# Patient Record
Sex: Female | Born: 1955 | Race: White | Hispanic: No | Smoking: Current some day smoker
Health system: Southern US, Community
[De-identification: ages and names within clinical notes are randomized; demographics above are authoritative.]

## PROBLEM LIST (undated history)

## (undated) HISTORY — PX: OTHER SURGICAL HISTORY: SHX169

---

## 1999-11-21 ENCOUNTER — Other Ambulatory Visit (HOSPITAL_COMMUNITY): Admission: RE | Admit: 1999-11-21 | Discharge: 1999-12-06 | Payer: Self-pay | Admitting: Psychiatry

## 2002-10-14 ENCOUNTER — Encounter: Admission: RE | Admit: 2002-10-14 | Discharge: 2002-10-14 | Payer: Self-pay | Admitting: Family Medicine

## 2002-10-14 ENCOUNTER — Encounter: Payer: Self-pay | Admitting: Family Medicine

## 2002-11-11 ENCOUNTER — Encounter: Admission: RE | Admit: 2002-11-11 | Discharge: 2002-11-11 | Payer: Self-pay | Admitting: Family Medicine

## 2003-08-04 ENCOUNTER — Encounter: Admission: RE | Admit: 2003-08-04 | Discharge: 2003-08-04 | Payer: Self-pay | Admitting: Family Medicine

## 2003-08-04 ENCOUNTER — Encounter: Payer: Self-pay | Admitting: Family Medicine

## 2004-01-01 ENCOUNTER — Encounter: Admission: RE | Admit: 2004-01-01 | Discharge: 2004-01-01 | Payer: Self-pay | Admitting: Family Medicine

## 2004-05-12 ENCOUNTER — Encounter: Admission: RE | Admit: 2004-05-12 | Discharge: 2004-05-12 | Payer: Self-pay | Admitting: Sports Medicine

## 2004-05-12 ENCOUNTER — Ambulatory Visit (HOSPITAL_COMMUNITY): Admission: RE | Admit: 2004-05-12 | Discharge: 2004-05-12 | Payer: Self-pay | Admitting: Family Medicine

## 2004-06-27 ENCOUNTER — Emergency Department (HOSPITAL_COMMUNITY): Admission: EM | Admit: 2004-06-27 | Discharge: 2004-06-27 | Payer: Self-pay | Admitting: Emergency Medicine

## 2004-10-18 ENCOUNTER — Ambulatory Visit: Payer: Self-pay | Admitting: Family Medicine

## 2005-02-23 ENCOUNTER — Encounter: Admission: RE | Admit: 2005-02-23 | Discharge: 2005-02-23 | Payer: Self-pay | Admitting: Sports Medicine

## 2005-08-10 ENCOUNTER — Ambulatory Visit: Payer: Self-pay | Admitting: Family Medicine

## 2006-03-31 ENCOUNTER — Encounter (INDEPENDENT_AMBULATORY_CARE_PROVIDER_SITE_OTHER): Payer: Self-pay | Admitting: *Deleted

## 2006-04-05 ENCOUNTER — Encounter: Payer: Self-pay | Admitting: Family Medicine

## 2006-04-05 ENCOUNTER — Ambulatory Visit: Payer: Self-pay | Admitting: Family Medicine

## 2006-04-16 ENCOUNTER — Ambulatory Visit: Payer: Self-pay | Admitting: Family Medicine

## 2006-12-12 ENCOUNTER — Encounter: Admission: RE | Admit: 2006-12-12 | Discharge: 2006-12-12 | Payer: Self-pay | Admitting: Family Medicine

## 2007-02-21 DIAGNOSIS — E039 Hypothyroidism, unspecified: Secondary | ICD-10-CM | POA: Insufficient documentation

## 2007-02-21 DIAGNOSIS — F17200 Nicotine dependence, unspecified, uncomplicated: Secondary | ICD-10-CM | POA: Insufficient documentation

## 2007-02-21 DIAGNOSIS — F339 Major depressive disorder, recurrent, unspecified: Secondary | ICD-10-CM | POA: Insufficient documentation

## 2007-02-22 ENCOUNTER — Encounter (INDEPENDENT_AMBULATORY_CARE_PROVIDER_SITE_OTHER): Payer: Self-pay | Admitting: *Deleted

## 2007-06-18 ENCOUNTER — Telehealth: Payer: Self-pay | Admitting: *Deleted

## 2007-06-27 ENCOUNTER — Encounter: Payer: Self-pay | Admitting: Family Medicine

## 2007-06-27 ENCOUNTER — Ambulatory Visit: Payer: Self-pay | Admitting: Family Medicine

## 2007-07-15 ENCOUNTER — Encounter: Payer: Self-pay | Admitting: Family Medicine

## 2007-10-16 ENCOUNTER — Encounter: Payer: Self-pay | Admitting: Family Medicine

## 2007-10-16 ENCOUNTER — Ambulatory Visit: Payer: Self-pay | Admitting: Family Medicine

## 2007-10-16 LAB — CONVERTED CEMR LAB
Cholesterol: 237 mg/dL — ABNORMAL HIGH (ref 0–200)
HDL: 61 mg/dL (ref >39)
LDL Cholesterol: 158 mg/dL — ABNORMAL HIGH (ref 0–99)
Total CHOL/HDL Ratio: 3.9
Triglycerides: 92 mg/dL (ref <150)
VLDL: 18 mg/dL (ref 0–40)

## 2007-10-18 ENCOUNTER — Encounter: Payer: Self-pay | Admitting: Family Medicine

## 2007-12-13 ENCOUNTER — Telehealth: Payer: Self-pay | Admitting: *Deleted

## 2007-12-17 ENCOUNTER — Encounter: Admission: RE | Admit: 2007-12-17 | Discharge: 2007-12-17 | Payer: Self-pay | Admitting: Family Medicine

## 2007-12-30 ENCOUNTER — Ambulatory Visit: Payer: Self-pay | Admitting: Family Medicine

## 2008-03-11 ENCOUNTER — Telehealth: Payer: Self-pay | Admitting: Family Medicine

## 2008-03-11 ENCOUNTER — Encounter: Payer: Self-pay | Admitting: Family Medicine

## 2008-12-03 ENCOUNTER — Ambulatory Visit: Payer: Self-pay | Admitting: Family Medicine

## 2008-12-03 ENCOUNTER — Encounter: Payer: Self-pay | Admitting: Family Medicine

## 2008-12-04 ENCOUNTER — Encounter: Payer: Self-pay | Admitting: Family Medicine

## 2008-12-23 ENCOUNTER — Encounter: Admission: RE | Admit: 2008-12-23 | Discharge: 2008-12-23 | Payer: Self-pay | Admitting: Family Medicine

## 2010-01-14 ENCOUNTER — Ambulatory Visit: Payer: Self-pay | Admitting: Family Medicine

## 2010-01-14 DIAGNOSIS — J209 Acute bronchitis, unspecified: Secondary | ICD-10-CM | POA: Insufficient documentation

## 2010-02-23 ENCOUNTER — Encounter: Admission: RE | Admit: 2010-02-23 | Discharge: 2010-02-23 | Payer: Self-pay | Admitting: Family Medicine

## 2010-03-17 ENCOUNTER — Ambulatory Visit: Payer: Self-pay | Admitting: Family Medicine

## 2010-03-17 LAB — CONVERTED CEMR LAB
LDL Cholesterol: 152 mg/dL — ABNORMAL HIGH (ref 0–99)
Pap Smear: NEGATIVE
TSH: 3.156 microintl units/mL (ref 0.350–4.500)
Total CHOL/HDL Ratio: 3.8
VLDL: 27 mg/dL (ref 0–40)

## 2010-03-18 ENCOUNTER — Encounter: Payer: Self-pay | Admitting: Family Medicine

## 2010-04-07 ENCOUNTER — Ambulatory Visit: Payer: Self-pay | Admitting: Family Medicine

## 2010-04-07 DIAGNOSIS — H9209 Otalgia, unspecified ear: Secondary | ICD-10-CM | POA: Insufficient documentation

## 2010-04-07 DIAGNOSIS — L821 Other seborrheic keratosis: Secondary | ICD-10-CM | POA: Insufficient documentation

## 2010-05-12 ENCOUNTER — Encounter: Payer: Self-pay | Admitting: Family Medicine

## 2010-05-12 ENCOUNTER — Ambulatory Visit: Payer: Self-pay | Admitting: Family Medicine

## 2010-05-12 ENCOUNTER — Telehealth: Payer: Self-pay | Admitting: Family Medicine

## 2010-05-17 ENCOUNTER — Telehealth: Payer: Self-pay | Admitting: Family Medicine

## 2010-10-07 ENCOUNTER — Ambulatory Visit: Payer: Self-pay | Admitting: Family Medicine

## 2010-10-07 DIAGNOSIS — L989 Disorder of the skin and subcutaneous tissue, unspecified: Secondary | ICD-10-CM | POA: Insufficient documentation

## 2010-11-30 ENCOUNTER — Ambulatory Visit: Payer: Self-pay | Admitting: Family Medicine

## 2010-11-30 DIAGNOSIS — R059 Cough, unspecified: Secondary | ICD-10-CM | POA: Insufficient documentation

## 2010-11-30 DIAGNOSIS — R05 Cough: Secondary | ICD-10-CM

## 2010-12-01 ENCOUNTER — Telehealth (INDEPENDENT_AMBULATORY_CARE_PROVIDER_SITE_OTHER): Payer: Self-pay | Admitting: *Deleted

## 2011-01-26 ENCOUNTER — Encounter: Payer: Self-pay | Admitting: *Deleted

## 2011-01-26 NOTE — Progress Notes (Signed)
Summary: phn msg  Phone Note Call from Patient Call back at Home Phone [phone]   Caller: Patient Summary of Call: pt is concerned about yellowish mucus she is coughing up Initial call taken by: De Nurse,  December 01, 2010 9:34 AM  Follow-up for Phone Call        spoke with patint and she was in to see Dr. Wallene Huh yesterday .  she needed reassurance about what MD told her yesterday. again advised patient if she has fever , symptoms continuing for another week to call us back. went over all th symptomatic treatmnt MD suggested.  Follow-up by: Theresia Lo RN,  December 01, 2010 10:49 AM

## 2011-01-26 NOTE — Progress Notes (Signed)
Summary: triage  Phone Note Call from Patient Call back at Work Phone [phone]   Caller: Patient Summary of Call: pt states that prednisone has helped and needs a little more to take care of the rest of the poison ivy [facility] [address] Initial call taken by: De Nurse,  May 17, 2010 9:29 AM  Follow-up for Phone Call        [facility] [address]. states she was back in yard & has new spots on her face. requesting a few more days of steroids. was just here & does not want to come back in. to pcp Follow-up by: Golden Circle RN,  May 17, 2010 9:31 AM  Additional Follow-up for Phone Call Additional follow up Details #1::        Let her know we sent it in and to take the tapering doses as prescribed thanks Additional Follow-up by: Pearlean Brownie MD,  May 17, 2010 10:19 AM    Additional Follow-up for Phone Call Additional follow up Details #2::    pt informed Follow-up by: Golden Circle RN,  May 17, 2010 11:10 AM  New/Updated Medications: PREDNISONE 1 MG TABS (PREDNISONE) 2 by mouth two times a day for 2 days then 3 per day for 2 days then 2 per day for 2 days the 1 a day for 2 days Prescriptions: PREDNISONE 1 MG TABS (PREDNISONE) 2 by mouth two times a day for 2 days then 3 per day for 2 days then 2 per day for 2 days the 1 a day for 2 days  #30 x 0   Entered and Authorized by:   Pearlean Brownie MD   Signed by:   Pearlean Brownie MD on 05/17/2010   Method used:   Electronically to        [facility]  [phone]* (retail)       [address] [place], [place]  [address]       Ph: [phone] or [phone]       Fax: [phone]   RxID:   [id number]

## 2011-01-26 NOTE — Miscellaneous (Signed)
Summary: Procedures Consent  Procedures Consent   Imported By: De Nurse 05/13/2010 16:07:11  _____________________________________________________________________  External Attachment:    Type:   Image     Comment:   External Document

## 2011-01-26 NOTE — Assessment & Plan Note (Signed)
Summary: cough,df   Vital Signs:  Patient profile:   55 year old female Height:      67.75 inches Weight:      208 pounds BMI:     31.98 Temp:     98.0 degrees F oral BP sitting:   112 / 78  (left arm)  Vitals Entered By: Tessie Fass, CMA CC: cough and congestion Is Patient Diabetic? No Pain Assessment Patient in pain? no        CC:  cough and congestion.  History of Present Illness: c/o cough for one week associated with running nose, weakness, bilateral ear pain and generalized malaise.  Cough is persistant, coughing up small amounts of mucus, some instances of yellowish sputum. States symptoms are somewhat improved with Theraflu and no aggravating factors.  Denies dizziness, facial pain, sorethroat, sob or chest pain.    Habits & Providers  Alcohol-Tobacco-Diet     Tobacco Status: current     Cigarette Packs/Day: <0.25  Current Medications (verified): 1)  Synthroid 150 Mcg Tabs (Levothyroxine Sodium) .... Take 1 Tablet By Mouth Once A Day 2)  Celexa 20 Mg Tabs (Citalopram Hydrobromide) .Marland Kitchen.. 1 Once Daily 3)  Hydromet 5-1.5 Mg/48ml Syrp (Hydrocodone-Homatropine) .... One Teaspoonful Three Times A Day As Needed For Cough, 120 Cc 4)  Ventolin Hfa 108 (90 Base) Mcg/act Aers (Albuterol Sulfate) .... 2 Puffs Q 4-6 As Needed Cough and Wheeze 5)  Prednisone 20 Mg Tabs (Prednisone) .... 2 Tabs Daily For 5 Days  Allergies (verified): No Known Drug Allergies  Social History: Packs/Day:  <0.25  Review of Systems General:  Complains of chills and malaise; denies fever, loss of appetite, and sweats. Eyes:  Denies discharge and eye irritation. ENT:  Complains of earache and nasal congestion; denies decreased hearing, difficulty swallowing, ear discharge, hoarseness, ringing in ears, sinus pressure, and sore throat. CV:  Denies chest pain or discomfort, difficulty breathing at night, difficulty breathing while lying down, and lightheadness. Resp:  Complains of cough and  wheezing; denies chest discomfort and shortness of breath.  Physical Exam  General:  Well-developed,well-nourished,in no acute distress; alert,appropriate and cooperative throughout examination Head:  Normocephalic and atraumatic without obvious abnormalities. No apparent alopecia or balding. Eyes:  No corneal or conjunctival inflammation noted. EOMI. Perrla. Vision grossly intact. Ears:  External ear exam shows no significant lesions or deformities.  Otoscopic examination reveals clear canals, tympanic membranes are intact bilaterally without bulging, retraction, inflammation or discharge. Hearing is grossly normal bilaterally. Nose:  no external deformity, no external erythema, no nasal discharge, no mucosal pallor, no mucosal edema, and no airflow obstruction.   Mouth:  Oral mucosa and oropharynx without lesions or exudates.  pharynx pink and moist and no erythema.   Neck:  No deformities, masses, or tenderness noted. Chest Wall:  No deformities, masses, or tenderness noted. Lungs:  normal respiratory effort, no intercostal retractions, no accessory muscle use, bilateral expiratory and inspiratory wheezing.   Heart:  Normal rate and regular rhythm. S1 and S2 normal without gallop, murmur, click, rub or other extra sounds. Skin:  Intact without suspicious lesions or rashes    Impression & Recommendations:  Problem # 1:  GYNECOLOGICAL EXAMINATIONOUTINE (ICD-V72.31) Persistent cough with bilateral wheezing on auscultation, but absent infectious process as evidenced by no fever, weakness, loss of appetitie or mucopurulent phlegm.  Will treat patient for bronchitis with Prednisone, Ventolin and Hydromet.  Discussed tobacco cessation.  Patient instructed in correct use of inhaler and principles of viral vs bacterial infection.  Instructed to return to office if symptoms worsen or if develops fever, productive cough, sob or chest pain.  Problem # 2:  TOBACCO DEPENDENCE (ICD-305.1) Counseled to  quit Orders: [facility]- Est Level  3 ([address])  Problem # 3:  HYPOTHYROIDISM, UNSPECIFIED (ICD-244.9) TSH due Her updated medication list for this problem includes:    Synthroid 150 Mcg Tabs (Levothyroxine sodium) .Marland Kitchen... Take 1 tablet by mouth once a day  Orders: TSH-[facility] ([address]11) [facility]- Est Level  3 ([address])  Complete Medication List: 1)  Synthroid 150 Mcg Tabs (Levothyroxine sodium) .... Take 1 tablet by mouth once a day 2)  Celexa 20 Mg Tabs (Citalopram hydrobromide) .Marland Kitchen.. 1 once daily 3)  Hydromet 5-1.5 Mg/29ml Syrp (Hydrocodone-homatropine) .... One teaspoonful three times a day as needed for cough, 120 cc 4)  Ventolin Hfa 108 (90 Base) Mcg/act Aers (Albuterol sulfate) .... 2 puffs q 4-6 as needed cough and wheeze 5)  Prednisone 20 Mg Tabs (Prednisone) .... 2 tabs daily for 5 days  Patient Instructions: 1)  Take prednisone as prescribed 2)  Use Ventolin as instructed for cough throughout day. 3)  Hydromet is also for cough but use when not driving or alertness is not required.  4)  Stop smoking. Drink plenty of fluids.  Return if symptoms get worse or if develop fever with productive cough, sob or chest pain. Prescriptions: PREDNISONE 20 MG TABS (PREDNISONE) 2 tabs daily for 5 days Brand medically necessary #1- x 0   Entered and Authorized by:   Luretha Murphy NP   Signed by:   Luretha Murphy NP on 01/14/2010   Method used:   Print then Give to Patient   RxID:   [id number] VENTOLIN HFA 108 (90 BASE) MCG/ACT AERS (ALBUTEROL SULFATE) 2 puffs q 4-6 as needed cough and wheeze Brand medically necessary #1 x 1   Entered and Authorized by:   Luretha Murphy NP   Signed by:   Luretha Murphy NP on 01/14/2010   Method used:   Print then Give to Patient   RxID:   [id number] HYDROMET 5-1.5 MG/5ML SYRP (HYDROCODONE-HOMATROPINE) one teaspoonful three times a day as needed for cough, 120 cc Brand medically necessary #1 x 0   Entered and Authorized by:   Luretha Murphy NP   Signed by:   Luretha Murphy NP  on 01/14/2010   Method used:   Print then Give to Patient   RxID:   [id number]   Appended Document: cough,df Incorrect diagnosis entered into [facility] problem #1, should read Acute Broncitis.

## 2011-01-26 NOTE — Assessment & Plan Note (Signed)
Summary: ? broken blood vessel to hand/chambliss/bmc   Vital Signs:  Patient profile:   55 year old female Height:      67.75 inches Weight:      210.25 pounds BMI:     32.32 BSA:     2.08 Pulse rate:   84 / minute BP sitting:   122 / 82  Vitals Entered By: Jone Baseman CMA (October 07, 2010 10:10 AM) CC: ?broken blood vessel in left hand x 1 week Is Patient Diabetic? No Pain Assessment Patient in pain? yes     Location: left hand Type: tender to touch   CC:  ?broken blood vessel in left hand x 1 week.  History of Present Illness: 55 yo F:  1. 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  Habits & Providers  Alcohol-Tobacco-Diet     Tobacco Status: current     Tobacco Counseling: to remain off tobacco products     Cigarette Packs/Day: <0.25  Current Medications (verified): 1)  Synthroid 150 Mcg Tabs (Levothyroxine Sodium) .... Take 1 Tablet By Mouth Once A Day  Allergies (verified): No Known Drug Allergies PMH-FH-SH reviewed for relevance  Review of Systems      See HPI  Physical Exam  General:  Well-developed, well-nourished, in no acute distress; alert, appropriate and cooperative throughout examination. Vitals reviewed. Skin:  Left Hand: Proximal palm with 1 cm diameter, nodule with blue hue, mildly TTP, no other erythema, warm, or skin changes, no open lesion, does not get smaller/flatter with elevation of hand, normal strength and reflexes UE. Right hand WNL, prominent blue vein in same area of palm.   Impression & Recommendations:  Problem # 1:  SKIN LESION (ICD-709.9) Assessment New  Unknown etiology at this time. No red flags. Precepted with Dr. McDiarmid. DDx: fibroma, vascular, traumatic, (very unlikely infectious). Patient to call in one week. If not improved, will send to Dermatology  for possible Bx.  Orders: [facility]- Est Level  3 ([address])  Complete Medication List: 1)  Synthroid 150 Mcg Tabs (Levothyroxine sodium) .... Take 1 tablet by mouth once a day  Patient Instructions: 1)  It was nice to meet you today! 2)  We are not sure what is causing the nodule on your palm. 3)  If it does not go away in 1 week, we are referring you to a dermatologist.

## 2011-01-26 NOTE — Assessment & Plan Note (Signed)
Summary: posion ivy per pt/[place]te/cham,bliss   Vital Signs:  Patient profile:   55 year old female Weight:      210.8 pounds Temp:     98 degrees F oral Pulse rate:   77 / minute Pulse rhythm:   regular BP sitting:   106 / 73  (left arm) Cuff size:   regular  Vitals Entered By: Loralee Pacas CMA (May 12, 2010 10:01 AM) CC: posion ivy x4 days, skin lesion Comments pt would also like for you to look at a "growth" that is on her right temple at the hairline that was previously sprayed with cold spray per patient   CC:  posion ivy x4 days and skin lesion.  History of Present Illness: 1.  poison ivy--thinks dog brought it in.  past 4 days.  red rash initially on left hand, but then rash erupted on face and right leg as well.  has had poison ivy before and seems to be very sensitive to it. has tried to wash everything, sheet, etc.  2.  lesion on forehead--thought to be a seb K.  had cryotherapy just over a month ago, but it never really went away.    Habits & Providers  Alcohol-Tobacco-Diet     Tobacco Status: current     Tobacco Counseling: to remain off tobacco products     Cigarette Packs/Day: <0.25  Current Medications (verified): 1)  Synthroid 150 Mcg Tabs (Levothyroxine Sodium) .... Take 1 Tablet By Mouth Once A Day  Allergies: No Known Drug Allergies  Social History: Smoking Status:  current  Review of Systems  The patient denies fever and weight loss.   General:  Denies fever. Derm:  Denies changes in nail beds, dryness, and poor wound healing.  Physical Exam  General:  Well-developed,well-nourished,in no acute distress; alert,appropriate and cooperative throughout examination Skin:  erythematous rash on left hand, left cheek and right of nasal bridge consistent with contact dermatitis  flesh colored rough 7 mm lesion on R forehead consistent with a seb k.  Freeze - thaw - freeze with liquid nitrogen performed tolerated well  Additional Exam:  vital signs reviewed      Impression & Recommendations:  Problem # 1:  POISON IVY DERMATITIS (ICD-692.6) Assessment New  given the rash on the face, will treat with prednisone.  can also use antihistamines for itching.   Her updated medication list for this problem includes:    Prednisone 20 Mg Tabs (Prednisone) .Marland Kitchen... 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  However, if it returns, likely needs biopsy to rule out basal cell.  advised pt to come back if lesion returns.  Orders: Cryo (1st lesion) benign - [facility] (17000) [facility]- Est Level  3 ([address])  Complete Medication List: 1)  Synthroid 150 Mcg Tabs (Levothyroxine sodium) .... Take 1 tablet by mouth once a day 2)  Prednisone 20 Mg Tabs (Prednisone) .... 2 tabs by mouth daily for 5 days for poison ivy  Patient Instructions: 1)  It was nice to see you today. 2)  For your poison ivy, take the prednisone I prescribed.   3)  You can also take benedryl or zyrtec for itching. 4)  You can use benedryl (diphenhydramine) cream to help with itching as well. 5)  If the spot on your forehead comes back, make an appointment to have it biopsied (  cut off). 6)  Call us if the rash gets any closer to your eye. Prescriptions: PREDNISONE 20 MG TABS (PREDNISONE) 2 tabs by mouth daily for 5 days for poison ivy  #10 x 0   Entered and Authorized by:   Asher Muir MD   Signed by:   Asher Muir MD on 05/12/2010   Method used:   [facility] to        [facility]  [phone]* (retail)       [address] [place], [place]  [address]       Ph: [phone] or [phone]       Fax: [phone]   RxID:   [id number] PREDNISONE 20 MG TABS (PREDNISONE) 2 tabs by mouth daily for 5 days for poison ivy  #10 x 0   Entered and Authorized by:   Asher Muir MD   Signed by:   Asher Muir MD on 05/12/2010    Method used:   Electronically to        [facility] # [phone]* (retail)       [address]       [place], [place]  [address]       Ph: [phone]       Fax: [phone]   RxID:   [id number]

## 2011-01-26 NOTE — Letter (Signed)
Summary: Generic Letter  [facility]  [address]   [place], [place] [address]   Phone: [phone]  Fax: [phone]    03/18/2010  Casha Frith [address] [place], [place]  [address]  Dear Ms. Zehner,  Here are your lab test results  Katlynn Kanzler  Tests: (1) Lipid Profile ([address])   Order Note: FASTING   Cholesterol          [H]  242 mg/dL                   9-629     ATP III Classification:           < 200        mg/dL        Desirable          200 - 239     mg/dL        Borderline High          >= 240        mg/dL        High         Triglyceride              133 mg/dL                   <528   HDL Cholesterol           63 mg/dL                    >41   Total Chol/HDL Ratio      3.8 Ratio  VLDL Cholesterol (Calc)                             27 mg/dL                    3-24  LDL Cholesterol (Calc)                        [H]  152 mg/dL                   4-01            Tests: (2) TSH ([address])   TSH                       3.156 uIU/mL                0.350-4.500  Your thyroid is good.  Your LDL cholesterol is too high but you HDL (good) is also high.  I believe you should continue to work on your weight with diet and exercise but do not think you need to take any medicine for your cholesterol.  We should recheck it in the next few years  Take Care  Sincerely,   Pearlean Brownie MD  Appended Document: Generic Letter mailed.

## 2011-01-26 NOTE — Assessment & Plan Note (Signed)
Summary: cpe,tcb   Vital Signs:  Patient profile:   55 year old female Height:      67.75 inches Weight:      213.1 pounds BMI:     32.76 Temp:     97.7 degrees F oral Pulse rate:   65 / minute BP sitting:   109 / 71  (left arm) Cuff size:   regular  Vitals Entered By: Garen Grams LPN (March 17, 2010 8:41 AM) CC: cpe Is Patient Diabetic? No Pain Assessment Patient in pain? no        CC:  cpe.  History of Present Illness: Feels well  Issues Lesion on R forehead came up suddenly a few months ago itchy crumbly  Easy bruising of hands - no other bleeding or bruising  ROS - as above PMH - Medications reviewed and updated in medication list.  Smoking Status noted in VS form      Habits & Providers  Alcohol-Tobacco-Diet     Tobacco Status: current     Cigarette Packs/Day: <0.25  Current Medications (verified): 1)  Synthroid 150 Mcg Tabs (Levothyroxine Sodium) .... Take 1 Tablet By Mouth Once A Day 2)  Celexa 20 Mg Tabs (Citalopram Hydrobromide) .Marland Kitchen.. 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 Half way through [employer] at [facility] 3-11.   Long time pet dog died in early 11-May-2010 was very attached  Review of Systems  The patient denies anorexia, fever, weight loss, weight gain, vision loss, decreased hearing, hoarseness, chest pain, syncope, dyspnea on exertion, peripheral edema, prolonged cough, headaches, hemoptysis, abdominal pain, melena, hematochezia, severe indigestion/heartburn, hematuria, incontinence, genital sores, muscle weakness, transient blindness, difficulty walking, depression, unusual weight change, abnormal bleeding, enlarged lymph nodes, angioedema, and breast masses.    Physical Exam  General:  Well-developed,well-nourished,in no acute distress; alert,appropriate and cooperative throughout examination Head:  Normocephalic  and atraumatic without obvious abnormalities. No apparent alopecia or balding. Ears:  External ear exam shows no significant lesions or deformities.  Otoscopic examination reveals clear canals, tympanic membranes are intact bilaterally without bulging, retraction, inflammation or discharge. Hearing is grossly normal bilaterally. Nose:  External nasal examination shows no deformity or inflammation. Nasal mucosa are pink and moist without lesions or exudates. Mouth:  Oral mucosa and oropharynx without lesions or exudates.  Teeth in good repair. Neck:  No deformities, masses, or tenderness noted. Breasts:  No mass, nodules, thickening, tenderness, bulging, retraction, inflamation, nipple discharge or skin changes noted.   Lungs:  Normal respiratory effort, chest expands symmetrically. Lungs are clear to auscultation, no crackles or wheezes. Heart:  Normal rate and regular rhythm. S1 and S2 normal without gallop, murmur, click, rub or other extra sounds. Abdomen:  Bowel sounds positive,abdomen soft and non-tender without masses, organomegaly or hernias noted.  obese Genitalia:  Normal introitus for age, no external lesions, no vaginal discharge, mucosa pink and moist, no vaginal or cervical lesions, no vaginal atrophy, no friaility or hemorrhage, normal uterus size and position, no adnexal masses or tenderness Msk:  No deformity or scoliosis noted of thoracic or lumbar spine.   Extremities:  No clubbing, cyanosis, edema, or deformity noted with normal full range of motion of all joints.   Skin:  Crusty stuck on sebk on left temple approximately 0.5 cm Cervical Nodes:  No lymphadenopathy noted Axillary Nodes:  No palpable lymphadenopathy   Impression & Recommendations:  Problem # 1:  Preventive Health Care (ICD-V70.0) no concerns on exam.  Discussed prevention items  Problem # 2:  HYPOTHYROIDISM, UNSPECIFIED (ICD-244.9) check tsh  Her updated medication list for this problem includes:     Synthroid 150 Mcg Tabs (Levothyroxine sodium) .Marland Kitchen... Take 1 tablet by mouth once a day  Orders: TSH-[facility] ([address])  Problem # 3:  DEPRESSION, MAJOR, RECURRENT (ICD-296.30) stable has not taken Celexa for several months and feels is doing well  despite loss of long time pet - dog  Problem # 4:  TOBACCO DEPENDENCE (ICD-305.1) discussed substitutes  Complete Medication List: 1)  Synthroid 150 Mcg Tabs (Levothyroxine sodium) .... Take 1 tablet by mouth once a day 2)  Celexa 20 Mg Tabs (Citalopram hydrobromide) .Marland Kitchen.. 1 once daily  Other Orders: Lipid-[facility] ([address]11-91478) Pap Smear- [facility] (Pap) [facility] - Est  40-64 yrs ([address])  Patient Instructions: 1)  Please schedule a follow-up appointment in 1 year.  2)  Come back to have your Seb K frozen 3)  I will call you if your lab is abnormal otherwise I will send you a letter within 2 weeks. 4)  Tobacco is very bad for your health and your loved ones ! You should stop smoking !  5)  Stop smoking tips: Choose a quit date. Cut down before the quit date. Decide what you will do as a substitute when you feel the urge to smoke(gum, toothpick, exercise).  6)  Schedule a colonoscopy to help detect colon cancer.    Prevention & Chronic Care Immunizations   Influenza vaccine: Not documented    Tetanus booster: 12/03/2008: Tdap    Pneumococcal vaccine: Not documented  Colorectal Screening   Hemoccult: Not documented    Colonoscopy: prefers Hemocults  (12/03/2008)   Colonoscopy due: Not Indicated  Other Screening   Pap smear: NEGATIVE FOR INTRAEPITHELIAL LESIONS OR MALIGNANCY.  (12/03/2008)   Pap smear due: 04/01/2007    Mammogram: ASSESSMENT: Negative - BI-RADS 1^MM DIGITAL SCREENING  (02/23/2010)   Mammogram due: 12/2008   Smoking status: current  (03/17/2010)   Smoking cessation counseling: YES  (03/17/2010)  Lipids   Total Cholesterol: 237  (10/16/2007)   LDL: 158  (10/16/2007)   LDL Direct: Not documented   HDL: 61  (10/16/2007)    Triglycerides: 92  (10/16/2007)

## 2011-01-26 NOTE — Assessment & Plan Note (Signed)
Summary: uri/yellowish mucous/chambliss/bmc   Vital Signs:  Patient profile:   55 year old female Height:      67.75 inches Weight:      208.25 pounds BMI:     32.01 Temp:     98.4 degrees F oral Pulse rate:   80 / minute BP sitting:   99 / 62  (left arm)  Vitals Entered By: Terese Door (November 30, 2010 10:53 AM) CC: URI x 2 weeks Is Patient Diabetic? No Pain Assessment Patient in pain? no        Primary Care Provider:  Pearlean Brownie MD  CC:  URI x 2 weeks.  History of Present Illness: 1) Cough: Productive cough with initially clear to now yellow mucus x 2 weeks. Has tried Sudafed and cough drops without relief initially but symptoms improving.  Smokes 1/2 pack per month.   ROS: Denies nausea, vomiting or diarrhea, myalgia, sore throat, fever, dyspnea, wheeze, rhinorrhea, nasal congestion, chest pain.  Habits & Providers  Alcohol-Tobacco-Diet     Tobacco Status: current     Tobacco Counseling: to quit use of tobacco products     Cigarette Packs/Day: <0.25  Current Medications (verified): 1)  Synthroid 150 Mcg Tabs (Levothyroxine Sodium) .... Take 1 Tablet By Mouth Once A Day  Allergies (verified): No Known Drug Allergies  Physical Exam  General:  obese, NAD. Vitals reviewed. Head:  no sinus tenderness to palpation  Eyes:  no conjunctivitis  Ears:  TMs clear bilaterally  Nose:  no congestion or rhinorrhea  Mouth:  moist membranes, no erythema or exudate  Neck:  no lymphadenopathy   Lungs:  Normal respiratory effort, chest expands symmetrically. Lungs are clear to auscultation, no crackles or wheezes. Extremities:  no edema      Impression & Recommendations:  Problem # 1:  COUGH (ICD-786.2) Assessment New  Cough likely secondary to viral (non-influenza) URI. Advised regarding symptom management including over the counter cough medications, throat lozenges, honey. Symptoms improving. Advised regarding red flag symptoms. Follow up as needed.    Orders: [facility]- Est Level  3 (44034)  Complete Medication List: 1)  Synthroid 150 Mcg Tabs (Levothyroxine sodium) .... Take 1 tablet by mouth once a day   Orders Added: 1)  [facility]- Est Level  3 [74259]

## 2011-01-26 NOTE — Progress Notes (Signed)
Summary: triage  Phone Note Call from Patient Call back at [phone] please page    Caller: Patient Summary of Call: Pt has poison Ivy on face and wondering if she can get something called in for this?   Initial call taken by: Clydell Hakim,  May 12, 2010 9:05 AM  Follow-up for Phone Call        states she has tried all OTCs w/o relief. told her she will need to be seen before md will call somthing in. she wants to come in now. told her she will see the work in md. knows it is not pcp Follow-up by: Golden Circle RN,  May 12, 2010 9:19 AM

## 2011-01-26 NOTE — Assessment & Plan Note (Signed)
Summary: removal lesion otalgia ,df   Vital Signs:  Patient profile:   55 year old female Height:      67.75 inches Weight:      211 pounds BMI:     32.44 BSA:     2.09 Temp:     98.4 degrees F Pulse rate:   74 / minute BP sitting:   104 / 68  Vitals Entered By: Jone Baseman CMA (April 07, 2010 8:33 AM) CC: remove lesion Is Patient Diabetic? No Pain Assessment Patient in pain? no        CC:  remove lesion.  History of Present Illness: Forehead lesion has been there for several months seems to be growing slowly.  No bleeding.  no color change  Ear Pain both ears intermittently for years.  Has seen ENT in distant past without a diagnosis.  No discharge or pain now.  No hearling loss.  Does work in Film/video editor.  ROS - as above PMH - Medications reviewed and updated in medication list.  Smoking Status noted in VS form    Habits & Providers  Alcohol-Tobacco-Diet     Tobacco Status: occasional  Current Medications (verified): 1)  Synthroid 150 Mcg Tabs (Levothyroxine Sodium) .... Take 1 Tablet By Mouth Once A Day  Allergies: No Known Drug Allergies  Social History: Smoking Status:  occasional  Physical Exam  General:  Well-developed,well-nourished,in no acute distress; alert,appropriate and cooperative throughout examination Ears:  External ear exam shows no significant lesions or deformities.  Otoscopic examination reveals clear canals, with a flesh colored smooth projection at 6 oclock in L canal without redness or skin breakdown takes up approx 5-10% of canal diameter.  tympanic membranes are intact bilaterally without bulging, retraction, inflammation or discharge. Hearing is grossly normal bilaterally. Skin:  flesh colored rough 7 mm lesion on R forehead consistent with a seb k.  Freeze - thaw - freeze with liquid nitrogen performed tolerated well    Impression & Recommendations:  Problem # 1:  SEBORRHEIC KERATOSIS (ICD-702.19) Assessment  New  very benign appearing.  Frozen should resolve completely   Orders: Cryo (1st lesion) benign - [facility] (17000)  Problem # 2:  EAR PAIN (ICD-388.70)  most consistent with eustachian tube dysfunction.  The projection in the canal most likely a benign skin growth but will monitor for symptoms and with exam   Orders: [facility]- Est Level  2 (16109)  Complete Medication List: 1)  Synthroid 150 Mcg Tabs (Levothyroxine sodium) .... Take 1 tablet by mouth once a day  Patient Instructions: 1)  Come back in 1 year 2)  Call if any signs of infection on your seb k 3)  It should crumble away in the next few days to weeks then use a bandaid.  If does not go away completely a month or so call us 4)  Try equilizing exercises for your ear pain 5)  Remember stop smoking approaches 6)  Colonoscopy

## 2011-02-16 ENCOUNTER — Telehealth: Payer: Self-pay | Admitting: Family Medicine

## 2011-02-16 DIAGNOSIS — E039 Hypothyroidism, unspecified: Secondary | ICD-10-CM

## 2011-02-16 MED ORDER — LEVOTHYROXINE SODIUM 150 MCG PO TABS: 150.0000 ug | ORAL_TABLET | Freq: Every day | ORAL | Status: DC

## 2011-02-16 NOTE — Telephone Encounter (Signed)
pts pharmacy is sending refill request for synthroid 3 month supply, please do not write on rx dispense as written or name brand only per pt.

## 2011-03-23 ENCOUNTER — Telehealth: Payer: Self-pay | Admitting: Family Medicine

## 2011-03-23 MED ORDER — CITALOPRAM HYDROBROMIDE 20 MG PO TABS: 20.0000 mg | ORAL_TABLET | Freq: Every day | ORAL | Status: DC

## 2011-03-23 NOTE — Telephone Encounter (Signed)
Done Britain Saber L

## 2011-03-23 NOTE — Telephone Encounter (Signed)
Was on Celexa last year and feels like she needs this again- has been seeing a clinical Child psychotherapist and she also suggested her going back on this.  Can she have this called [facility] [address] Please call to let her know - she made appt for 4/11 w/ chambliss

## 2011-04-05 ENCOUNTER — Ambulatory Visit: Payer: Self-pay | Admitting: Family Medicine

## 2011-04-12 ENCOUNTER — Ambulatory Visit: Payer: Self-pay | Admitting: Family Medicine

## 2011-04-19 ENCOUNTER — Ambulatory Visit (INDEPENDENT_AMBULATORY_CARE_PROVIDER_SITE_OTHER): Payer: BC Managed Care – PPO | Admitting: Family Medicine

## 2011-04-19 ENCOUNTER — Encounter: Payer: Self-pay | Admitting: Family Medicine

## 2011-04-19 VITALS — BP 124/77 | HR 68 | Temp 97.9°F | Wt 202.8 lb

## 2011-04-19 DIAGNOSIS — Z1211 Encounter for screening for malignant neoplasm of colon: Secondary | ICD-10-CM

## 2011-04-19 DIAGNOSIS — E039 Hypothyroidism, unspecified: Secondary | ICD-10-CM

## 2011-04-19 LAB — TSH: TSH: 0.657 u[IU]/mL (ref 0.350–4.500)

## 2011-04-19 NOTE — Progress Notes (Signed)
  Subjective:    Patient ID: Krista Daugherty, female    DOB: 1956-08-11, 55 y.o.   MRN: [id number]  HPI  Depression Current mood:  Good, able to function and plan well Sleep: improved intermittently uses melatonin Activity:  More energy Evidence of suicidal ideation:  no Medication:  Antony Madura 1/2 celexa Side effects none  Hypothyroidism Weight changes:  Perhaps going down some  Skin Changes:  none Palpitations:  no Heat/Cold intolerance:  no Medication Use:  Daily synthroid   Review of Symptoms - see HPI  PMH - Smoking status noted.  She smokes socially only   Review of Systems     Objective:   Physical Exam Heart - Regular rate and rhythm.  No murmurs, gallops or rubs.    Lungs:  Normal respiratory effort, chest expands symmetrically. Lungs are clear to auscultation, no crackles or wheezes. Neck:  No deformities, thyromegaly, masses, or tenderness noted.   Supple with full range of motion without pain. Extremities:  No cyanosis, edema, or deformity noted with good range of motion of all major joints.          Assessment & Plan:

## 2011-04-19 NOTE — Patient Instructions (Signed)
I will call you if your tests are not good.  Otherwise I will send you a letter.  If you do not hear from me with in 2 weeks please call our office.     Exercise at least 20 minutes 5 x a week  Stopping smoking is best  Will send referral to [facility]  Next pap smear is due in 2014  Try a cough medicine with Dexamethorapan as the active ingredient  Hydrocortisone ointment twice a day for the itchy area

## 2011-04-20 ENCOUNTER — Encounter: Payer: Self-pay | Admitting: Family Medicine

## 2011-04-21 ENCOUNTER — Telehealth: Payer: Self-pay | Admitting: Family Medicine

## 2011-04-21 NOTE — Telephone Encounter (Signed)
Krista Daugherty cancelled with [facility] because that wasn't whom she wanted her appt with.  She will contat  Dr. Renie Ora office herself to make the appt and if there is any reason, which they will probably need referral please give it and let pt know.

## 2011-04-21 NOTE — Telephone Encounter (Signed)
Pls check to see if referral was made to [facility] not [facility].   If it was then please send to [facility] Thanks

## 2011-04-21 NOTE — Telephone Encounter (Signed)
Called Dr. Kenna Gilbert office and gave them ok for referral, she has already scheduled for 05/09/11 at 4pm.

## 2011-04-28 ENCOUNTER — Other Ambulatory Visit: Payer: Self-pay | Admitting: Family Medicine

## 2011-04-28 DIAGNOSIS — Z1231 Encounter for screening mammogram for malignant neoplasm of breast: Secondary | ICD-10-CM

## 2011-05-03 ENCOUNTER — Telehealth: Payer: Self-pay | Admitting: Family Medicine

## 2011-05-03 NOTE — Telephone Encounter (Signed)
Please call and ask her to contact her pharmacy and for her to ask them to send [place] an electronic refill request Thanks LC

## 2011-05-03 NOTE — Telephone Encounter (Signed)
Need refill for [facility]itolapram for [facility] [address]

## 2011-05-08 ENCOUNTER — Other Ambulatory Visit: Payer: Self-pay | Admitting: Family Medicine

## 2011-05-08 NOTE — Telephone Encounter (Signed)
Refill request

## 2011-05-10 ENCOUNTER — Other Ambulatory Visit: Payer: Self-pay | Admitting: Family Medicine

## 2011-05-10 MED ORDER — CITALOPRAM HYDROBROMIDE 20 MG PO TABS: 20.0000 mg | ORAL_TABLET | Freq: Every day | ORAL | Status: DC

## 2011-05-16 ENCOUNTER — Ambulatory Visit (HOSPITAL_COMMUNITY): Payer: BC Managed Care – PPO

## 2011-06-01 ENCOUNTER — Ambulatory Visit: Payer: BC Managed Care – PPO | Admitting: Family Medicine

## 2011-06-01 ENCOUNTER — Encounter: Payer: Self-pay | Admitting: Family Medicine

## 2011-06-01 ENCOUNTER — Ambulatory Visit (INDEPENDENT_AMBULATORY_CARE_PROVIDER_SITE_OTHER): Payer: BC Managed Care – PPO | Admitting: Family Medicine

## 2011-06-01 VITALS — BP 115/77 | HR 65 | Temp 98.1°F | Ht 68.0 in | Wt 204.3 lb

## 2011-06-01 DIAGNOSIS — J069 Acute upper respiratory infection, unspecified: Secondary | ICD-10-CM

## 2011-06-01 MED ORDER — AZITHROMYCIN 500 MG PO TABS: 500.0000 mg | ORAL_TABLET | Freq: Every day | ORAL | Status: AC

## 2011-06-01 MED ORDER — HYDROCODONE-HOMATROPINE 5-1.5 MG/5ML PO SYRP: 5.0000 mL | ORAL_SOLUTION | Freq: Four times a day (QID) | ORAL | Status: AC | PRN

## 2011-06-01 NOTE — Progress Notes (Signed)
  Subjective:    Patient ID: Krista Daugherty, female    DOB: 11-06-1956, 55 y.o.   MRN: [id number]  HPI 1.  URI symptoms:  Cough and runny nose for past 5 days.  Cough is now worsening, wakes her at night, worse in evening.  Has tried OTC cough syrup and OTC sinus relief, but no relief experienced.  Is going to [facility] and wants to feel better before going.  No fevers, some chills.  Diagnosed with "walking PNA" by x-ray last year, almost identical symptoms.     Review of Systems See HPI above for review of systems.       Objective:   Physical Exam Gen:  Alert, cooperative patient who appears stated age.  Vital signs reviewed.  Runny nose, coughing, mild distress from symptoms.   Eyes:  EOMI, PERRL Nose:  Nasal turbinates erythematous, edematous, mild exudate. Ears: Right TM and canal normal.  Left TM Red, mildly bulging. Mouth:  No erythema or edema of tonsils Neck: No masses or thyromegaly or limitation in range of motion.  No cervical lymphadenopathy. Pulm:  Clear to auscultation bilaterally with good air movement.  No wheezes or rales noted.   Cardiac:  Regular rate and rhythm without murmur auscultated.  Good S1/S2. Skin:  No rash        Assessment & Plan:

## 2011-06-01 NOTE — Assessment & Plan Note (Signed)
Likely URI, possible LRTI.   No objective fevers, does have some chills. Will treat with Hycodan for cough, 3 day course Azithro as known history atypical PNA, although lungs are clear now.   FU if no improvement.

## 2011-06-01 NOTE — Patient Instructions (Signed)
Take the antibiotic for the next 3 days, starting today. Be careful driving after the cough syrup as it has hydrocodone in it.  You can take it every 6 hours. Have fun this weekend!

## 2011-07-11 ENCOUNTER — Ambulatory Visit (HOSPITAL_COMMUNITY)
Admission: RE | Admit: 2011-07-11 | Discharge: 2011-07-11 | Disposition: A | Discharge status: Home / Self Care | Payer: BC Managed Care – PPO | Source: Ambulatory Visit | Attending: Family Medicine | Admitting: Family Medicine

## 2011-07-11 DIAGNOSIS — Z1231 Encounter for screening mammogram for malignant neoplasm of breast: Secondary | ICD-10-CM | POA: Insufficient documentation

## 2011-08-23 ENCOUNTER — Telehealth: Payer: Self-pay | Admitting: Family Medicine

## 2011-08-23 NOTE — Telephone Encounter (Signed)
Please let her know  She can take an extra pill but I would need to see her to in the office to make sure everything is ok once she has been taking the increased dose for a few weeks and then will write a new Rx  We did not routinely check a T3 or 4.  Her TSH indicates she was on the correct dose  Thanks  LC

## 2011-08-23 NOTE — Telephone Encounter (Signed)
Krista Daugherty is calling to see if it would be ok to take an extra 1/2 a pill of Celexa.  If that is ok, she will need more of the medication and [facility] provides that so that her insurance will pay.  She would like to talk to someone to find out if this is ok to do and if so, an increase Rx will need to be sent to [facility].  She also wants to know if when testing the Thyroid is it testing for T3 and T4?  If she is not at her desk, it is ok to leave a message.

## 2011-08-24 MED ORDER — CITALOPRAM HYDROBROMIDE 20 MG PO TABS
30.0000 mg | ORAL_TABLET | Freq: Every day | ORAL | Status: DC
Start: 2011-08-24 — End: 2012-07-18

## 2011-08-24 NOTE — Telephone Encounter (Signed)
Spoke with her she is doing well on 30 mg daily (1.5 tabs) will refill

## 2011-08-24 NOTE — Telephone Encounter (Signed)
Spoke with Krista Daugherty, she states that she has already been taking the increased dose for a few weeks now, and really doesn't have the money to come in right now. Wants to know if MD could send in new rx to her pharmacy without her being seen.

## 2011-11-06 ENCOUNTER — Ambulatory Visit: Payer: BC Managed Care – PPO | Admitting: Family Medicine

## 2011-11-08 ENCOUNTER — Ambulatory Visit (INDEPENDENT_AMBULATORY_CARE_PROVIDER_SITE_OTHER): Payer: BC Managed Care – PPO | Admitting: Family Medicine

## 2011-11-08 ENCOUNTER — Encounter: Payer: Self-pay | Admitting: Family Medicine

## 2011-11-08 DIAGNOSIS — R079 Chest pain, unspecified: Secondary | ICD-10-CM | POA: Insufficient documentation

## 2011-11-08 DIAGNOSIS — F17200 Nicotine dependence, unspecified, uncomplicated: Secondary | ICD-10-CM

## 2011-11-08 NOTE — Progress Notes (Signed)
  Subjective:    Patient ID: Krista Daugherty, female    DOB: 05-17-1956, 55 y.o.   MRN: [id number]  HPI  CHEST PAIN  Location: mid epigastric Quality: pressure burning Duration: few minutes to 30 minutes Onset (rest, exertion): after drinking red wine or big meal or lying down or smoking Radiation: no Better with: just goes away Worse with: above  Symptoms History of Trauma/lifting: no  Nausea/vomiting: no  Diaphoresis: no  Shortness of breath: no  Pleuritic: no  Cough: no  Edema: no  Orthopnea: no  PND: no  Dizziness: no  Palpitations: no  Syncope: no  Indigestion: yes, as abov   Red Flags Worse with exertion: no  Recent Immobility: no  Cancer history: no  Tearing/radiation to back: no   Review of Symptoms - see HPI  PMH - Smoking status noted.   FH is adopteed  Review of Systems     Objective:   Physical Exam  Heart - Regular rate and rhythm.  No murmurs, gallops or rubs.    Lungs:  Normal respiratory effort, chest expands symmetrically. Lungs are clear to auscultation, no crackles or wheezes. Neck:  No deformities, thyromegaly, masses, or tenderness noted.   Supple with full range of motion without pain. Abdomen: soft and non-tender without masses, organomegaly or hernias noted.  No guarding or rebound Extremities:  No cyanosis, edema, or deformity noted with good range of motion of all major joints.         Assessment & Plan:

## 2011-11-08 NOTE — Patient Instructions (Signed)
Take Mylanta or Maalox liquid for immediate relief  Use Ranitidine (Zantac) or Omeprazole (Prilosec)  OTC for prolonged relief  Avoid alcohol, tobacco, chocolate, peppermint  If the chest pain is not better or if any during exercise or not related to the above then come back  Get your flu shot

## 2011-11-08 NOTE — Assessment & Plan Note (Signed)
She smokes about 2 cigs per day.   Precontemplative.  Dicussed approaches

## 2011-11-08 NOTE — Assessment & Plan Note (Signed)
New.  Very consistent with GERD.  No red flags for coronary artery disease or pulmonary emboli.  Will treat with otc antiacids and monitor.

## 2012-05-08 ENCOUNTER — Other Ambulatory Visit: Payer: Self-pay | Admitting: Family Medicine

## 2012-05-08 DIAGNOSIS — E039 Hypothyroidism, unspecified: Secondary | ICD-10-CM

## 2012-05-08 NOTE — Telephone Encounter (Signed)
Patient is needing a new Rx for Synthroid 90 day supply and it has to go through [facility].  The # to call is [phone].  Since she has been on the same dose for years, she does not want to have to be seen to get her meds.  Please give her a call when this has been completed.

## 2012-05-09 MED ORDER — LEVOTHYROXINE SODIUM 150 MCG PO TABS: 150.0000 ug | ORAL_TABLET | Freq: Every day | ORAL | Status: DC

## 2012-05-16 ENCOUNTER — Other Ambulatory Visit: Payer: Self-pay | Admitting: Family Medicine

## 2012-07-18 ENCOUNTER — Other Ambulatory Visit: Payer: Self-pay | Admitting: Family Medicine

## 2012-07-23 ENCOUNTER — Other Ambulatory Visit: Payer: Self-pay | Admitting: Family Medicine

## 2012-07-23 DIAGNOSIS — E039 Hypothyroidism, unspecified: Secondary | ICD-10-CM

## 2012-09-26 ENCOUNTER — Other Ambulatory Visit: Payer: Self-pay | Admitting: Family Medicine

## 2012-09-26 DIAGNOSIS — Z1231 Encounter for screening mammogram for malignant neoplasm of breast: Secondary | ICD-10-CM

## 2012-10-01 ENCOUNTER — Other Ambulatory Visit: Payer: Self-pay | Admitting: Family Medicine

## 2012-10-02 ENCOUNTER — Telehealth: Payer: Self-pay | Admitting: Family Medicine

## 2012-10-02 ENCOUNTER — Other Ambulatory Visit: Payer: Self-pay | Admitting: Family Medicine

## 2012-10-02 NOTE — Telephone Encounter (Signed)
FYI to PCP

## 2012-10-02 NOTE — Telephone Encounter (Signed)
[facility] will be faxing request for refill on the Synthroid, but the quanity currently on file is for 30 and patient is requesting change to 90 due to insurance requirements.  If there are any questions or concerns please call patient back at number given for contact.

## 2012-10-03 NOTE — Telephone Encounter (Signed)
Please let her know she needs an office visit before can give more than 20 which I sent in.  We need to check her blood test thanks

## 2012-10-03 NOTE — Telephone Encounter (Signed)
Left message on patient voicemail informing her of message from MD, and asking her to schedule an appointment with PCP.

## 2012-10-17 ENCOUNTER — Ambulatory Visit (HOSPITAL_COMMUNITY)
Admission: RE | Admit: 2012-10-17 | Discharge: 2012-10-17 | Disposition: A | Discharge status: Home / Self Care | Payer: 59 | Source: Ambulatory Visit | Attending: Family Medicine | Admitting: Family Medicine

## 2012-10-17 DIAGNOSIS — Z1231 Encounter for screening mammogram for malignant neoplasm of breast: Secondary | ICD-10-CM | POA: Insufficient documentation

## 2012-10-21 ENCOUNTER — Encounter: Payer: BC Managed Care – PPO | Admitting: Family Medicine

## 2012-10-23 ENCOUNTER — Encounter: Payer: BC Managed Care – PPO | Admitting: Family Medicine

## 2012-11-14 ENCOUNTER — Other Ambulatory Visit: Payer: Self-pay | Admitting: Family Medicine

## 2012-11-14 NOTE — Telephone Encounter (Signed)
Patient is calling requesting 1 more refill on Synthroid for December.  If she can wait until January to be seen, her appt will be covered by her insurance.  She uses [facility].

## 2012-11-15 ENCOUNTER — Other Ambulatory Visit: Payer: Self-pay | Admitting: Family Medicine

## 2012-11-15 MED ORDER — LEVOTHYROXINE SODIUM 150 MCG PO TABS: 150.0000 ug | ORAL_TABLET | Freq: Every day | ORAL | Status: DC

## 2012-11-15 NOTE — Telephone Encounter (Signed)
Pt informed. Krista Daugherty  

## 2012-11-15 NOTE — Telephone Encounter (Signed)
Done please inform

## 2013-02-05 ENCOUNTER — Encounter: Payer: BC Managed Care – PPO | Admitting: Family Medicine

## 2013-02-11 ENCOUNTER — Telehealth: Payer: Self-pay | Admitting: Family Medicine

## 2013-02-11 DIAGNOSIS — Z13220 Encounter for screening for lipoid disorders: Secondary | ICD-10-CM

## 2013-02-11 DIAGNOSIS — E039 Hypothyroidism, unspecified: Secondary | ICD-10-CM

## 2013-02-11 DIAGNOSIS — Z114 Encounter for screening for human immunodeficiency virus [HIV]: Secondary | ICD-10-CM

## 2013-02-11 NOTE — Telephone Encounter (Signed)
Patient needs a refill sent to [facility] for her Synthroid.  She has had rescheduled her physical because she has a cold and wants to come in prior to her physical appt for labs, but an order needs to be entered for this.  She would like labs to check for everything and is going to be fasting for this.  She wants her Cholesterol, sugar, std's including hiv and anything else dr. Deirdre Priest feels is pertinent.

## 2013-02-11 NOTE — Telephone Encounter (Signed)
Will forward to Dr Deirdre Priest, pt's lab appt is 2/24 and her appt w/Dr Deirdre Priest is 3/3/

## 2013-02-12 ENCOUNTER — Encounter: Payer: BC Managed Care – PPO | Admitting: Family Medicine

## 2013-02-13 DIAGNOSIS — Z13220 Encounter for screening for lipoid disorders: Secondary | ICD-10-CM | POA: Insufficient documentation

## 2013-02-13 DIAGNOSIS — Z114 Encounter for screening for human immunodeficiency virus [HIV]: Secondary | ICD-10-CM | POA: Insufficient documentation

## 2013-02-19 ENCOUNTER — Other Ambulatory Visit: Payer: BC Managed Care – PPO

## 2013-02-24 ENCOUNTER — Encounter: Payer: BC Managed Care – PPO | Admitting: Family Medicine

## 2013-03-04 ENCOUNTER — Other Ambulatory Visit: Payer: BC Managed Care – PPO

## 2013-03-10 ENCOUNTER — Encounter: Payer: BC Managed Care – PPO | Admitting: Family Medicine

## 2013-03-10 ENCOUNTER — Other Ambulatory Visit: Payer: BC Managed Care – PPO

## 2013-03-24 ENCOUNTER — Other Ambulatory Visit (HOSPITAL_COMMUNITY)
Admission: RE | Admit: 2013-03-24 | Discharge: 2013-03-24 | Disposition: A | Discharge status: Home / Self Care | Payer: 59 | Source: Ambulatory Visit | Attending: Family Medicine | Admitting: Family Medicine

## 2013-03-24 ENCOUNTER — Ambulatory Visit (INDEPENDENT_AMBULATORY_CARE_PROVIDER_SITE_OTHER): Payer: 59 | Admitting: Family Medicine

## 2013-03-24 ENCOUNTER — Encounter: Payer: Self-pay | Admitting: Family Medicine

## 2013-03-24 VITALS — BP 110/78 | HR 60 | Temp 97.9°F | Ht 68.0 in | Wt 194.0 lb

## 2013-03-24 DIAGNOSIS — Z113 Encounter for screening for infections with a predominantly sexual mode of transmission: Secondary | ICD-10-CM | POA: Insufficient documentation

## 2013-03-24 DIAGNOSIS — Z124 Encounter for screening for malignant neoplasm of cervix: Secondary | ICD-10-CM

## 2013-03-24 DIAGNOSIS — Z202 Contact with and (suspected) exposure to infections with a predominantly sexual mode of transmission: Secondary | ICD-10-CM

## 2013-03-24 DIAGNOSIS — F339 Major depressive disorder, recurrent, unspecified: Secondary | ICD-10-CM

## 2013-03-24 DIAGNOSIS — Z01419 Encounter for gynecological examination (general) (routine) without abnormal findings: Secondary | ICD-10-CM | POA: Insufficient documentation

## 2013-03-24 DIAGNOSIS — Z13228 Encounter for screening for other metabolic disorders: Secondary | ICD-10-CM

## 2013-03-24 DIAGNOSIS — Z130 Encounter for screening for diseases of the blood and blood-forming organs and certain disorders involving the immune mechanism: Secondary | ICD-10-CM

## 2013-03-24 DIAGNOSIS — Z2089 Contact with and (suspected) exposure to other communicable diseases: Secondary | ICD-10-CM

## 2013-03-24 DIAGNOSIS — Z1321 Encounter for screening for nutritional disorder: Secondary | ICD-10-CM

## 2013-03-24 DIAGNOSIS — F17200 Nicotine dependence, unspecified, uncomplicated: Secondary | ICD-10-CM

## 2013-03-24 DIAGNOSIS — E039 Hypothyroidism, unspecified: Secondary | ICD-10-CM

## 2013-03-24 MED ORDER — LEVOTHYROXINE SODIUM 150 MCG PO TABS
150.0000 ug | ORAL_TABLET | Freq: Every day | ORAL | Status: DC
Start: 2013-03-24 — End: 2014-04-18

## 2013-03-24 NOTE — Progress Notes (Signed)
  Subjective:    Patient ID: Krista Daugherty, female    DOB: 05-06-1956, 57 y.o.   MRN: [id number]  HPI  Well Woman Exam  HYPOTHYROIDISM Disease Monitoring Weight changes: no  Skin Changes: no Palpitations: no Heat/Cold intolerance: no Medication Monitoring Compliance:  Has been taking qod since was running out   Last TSH:   Lab Results  Component Value Date   TSH 0.657 04/19/2011      Feels well except for occasional heart burn after red wine  Patient reports no  vision/ hearing changes,anorexia, weight change, fever ,adenopathy, persistant / recurrent hoarseness, swallowing issues, chest pain, edema,persistant / recurrent cough, hemoptysis, dyspnea(rest, exertional, paroxysmal nocturnal), gastrointestinal  bleeding (melena, rectal bleeding), abdominal pain, excessive heart burn, GU symptoms(dysuria, hematuria, pyuria, voiding/incontinence  Issues) syncope, focal weakness, severe memory loss, concerning skin lesions, depression, anxiety, abnormal bruising/bleeding, major joint swelling, breast masses or abnormal vaginal bleeding.       Review of Systems     Objective:   Physical Exam  Alert no acute distress Heart - Regular rate and rhythm.  No murmurs, gallops or rubs.    Lungs:  Normal respiratory effort, chest expands symmetrically. Lungs are clear to auscultation, no crackles or wheezes. Neck:  No deformities, thyromegaly, masses, or tenderness noted.   Supple with full range of motion without pain. Extremities:  No cyanosis, edema, or deformity noted with good range of motion of all major joints.   Genitalia:  Normal introitus for age, no external lesions, no vaginal discharge, mucosa pink and moist, no vaginal or cervical lesions, no vaginal atrophy, no friaility or hemorrhage, normal uterus size and position, no adnexal masses or tenderness       Assessment & Plan:   Wellness Healthy with normal exam  Suggested limiting alcohol to 2 glasses in one 24 hour  period Continue weight loss Stop Smoking

## 2013-03-24 NOTE — Patient Instructions (Addendum)
After taking your thyroid regularly for 6 weeks come in for blood tests we will check HIV, thyroid and cholesterol  Great work with your weight - keep it up!  Your goal is about 175-180 lbs  If your pap is normal we can wait 3 years before your next one

## 2013-03-25 NOTE — Assessment & Plan Note (Signed)
Well controlled on her current dose

## 2013-03-25 NOTE — Assessment & Plan Note (Signed)
Smokes a pack every few months when out with friends.  Suggested stopping all together.  She is unsure

## 2013-03-25 NOTE — Assessment & Plan Note (Signed)
Will need to check labs. Clinically seems controlled

## 2013-05-02 ENCOUNTER — Encounter: Payer: Self-pay | Admitting: Family Medicine

## 2013-05-02 ENCOUNTER — Ambulatory Visit (INDEPENDENT_AMBULATORY_CARE_PROVIDER_SITE_OTHER): Payer: 59 | Admitting: Family Medicine

## 2013-05-02 VITALS — BP 117/62 | HR 77 | Temp 98.7°F | Ht 68.0 in | Wt 192.9 lb

## 2013-05-02 DIAGNOSIS — J301 Allergic rhinitis due to pollen: Secondary | ICD-10-CM

## 2013-05-02 DIAGNOSIS — Z9109 Other allergy status, other than to drugs and biological substances: Secondary | ICD-10-CM

## 2013-05-02 NOTE — Progress Notes (Signed)
Patient ID: Krista Daugherty, female   DOB: 1956/07/18, 57 y.o.   MRN: [id number]

## 2013-05-02 NOTE — Progress Notes (Signed)
Patient ID: Krista Daugherty, female   DOB: Apr 07, 1956, 57 y.o.   MRN: [id number] Subjective: The patient is a 57 y.o. year old female who presents today for sore throat. Sore throat began 4 days ago. It has not been worsening. It began after an evening out partying with friends. She has been having some postnasal drip and a mild dry cough. No fevers, chills, shortness of breath, facial pain, eye itching, or ear fullness.  Patient's past medical, social, and family history were reviewed and updated as appropriate. History  Substance Use Topics  . Smoking status: Current Some Day Smoker    Types: Cigarettes  . Smokeless tobacco: Never Used  . Alcohol Use: 7.0 oz/week    14 drink(s) per week   Objective:  Filed Vitals:   05/02/13 0859  BP: 117/62  Pulse: 77  Temp: 98.7 F (37.1 C)   Gen: No acute distress HEENT: Mucous members moist, extraocular movements intact, tympanic membranes normal bilaterally. Significant postnasal drip with mucosal cobblestoning. No pharyngeal or tonsillar exudates. No cervical adenopathy. Respiratory: Lungs are clear to escalation bilaterally.  Assessment/Plan: Sore throat likely related to environmental allergies. Recommend over-the-counter antihistamines, salt water gargles and nasal saline spray. Return to clinic if not doing better in 10-14 days or if develops high fevers, shortness of breath, or significant facial pain.  Please also see individual problems in problem list for problem-specific plans.

## 2013-05-13 ENCOUNTER — Other Ambulatory Visit: Payer: 59

## 2013-05-15 ENCOUNTER — Other Ambulatory Visit: Payer: 59

## 2013-05-22 ENCOUNTER — Other Ambulatory Visit: Payer: 59

## 2013-05-23 ENCOUNTER — Telehealth: Payer: Self-pay | Admitting: Family Medicine

## 2013-05-23 ENCOUNTER — Other Ambulatory Visit: Payer: 59

## 2013-05-23 DIAGNOSIS — Z13220 Encounter for screening for lipoid disorders: Secondary | ICD-10-CM

## 2013-05-23 DIAGNOSIS — Z1321 Encounter for screening for nutritional disorder: Secondary | ICD-10-CM

## 2013-05-23 DIAGNOSIS — E039 Hypothyroidism, unspecified: Secondary | ICD-10-CM

## 2013-05-23 DIAGNOSIS — Z114 Encounter for screening for human immunodeficiency virus [HIV]: Secondary | ICD-10-CM

## 2013-05-23 LAB — LIPID PANEL
LDL Cholesterol: 137 mg/dL — ABNORMAL HIGH (ref 0–99)
Total CHOL/HDL Ratio: 3.8 Ratio
VLDL: 35 mg/dL (ref 0–40)

## 2013-05-23 NOTE — Telephone Encounter (Signed)
Patient needs a refill of her Citalopram sent to [facility].

## 2013-05-26 MED ORDER — CITALOPRAM HYDROBROMIDE 20 MG PO TABS: 30.0000 mg | ORAL_TABLET | Freq: Every day | ORAL | Status: DC

## 2013-05-28 ENCOUNTER — Encounter: Payer: Self-pay | Admitting: Family Medicine

## 2013-10-30 ENCOUNTER — Other Ambulatory Visit: Payer: Self-pay

## 2014-04-18 ENCOUNTER — Other Ambulatory Visit: Payer: Self-pay | Admitting: Family Medicine

## 2014-07-16 ENCOUNTER — Other Ambulatory Visit: Payer: Self-pay | Admitting: Family Medicine

## 2014-07-16 DIAGNOSIS — Z1231 Encounter for screening mammogram for malignant neoplasm of breast: Secondary | ICD-10-CM

## 2014-07-21 ENCOUNTER — Ambulatory Visit (HOSPITAL_COMMUNITY)
Admission: RE | Admit: 2014-07-21 | Discharge: 2014-07-21 | Disposition: A | Discharge status: Home / Self Care | Payer: 59 | Source: Ambulatory Visit | Attending: Family Medicine | Admitting: Family Medicine

## 2014-07-21 DIAGNOSIS — Z1231 Encounter for screening mammogram for malignant neoplasm of breast: Secondary | ICD-10-CM | POA: Diagnosis present

## 2015-11-24 ENCOUNTER — Ambulatory Visit (INDEPENDENT_AMBULATORY_CARE_PROVIDER_SITE_OTHER): Payer: BLUE CROSS/BLUE SHIELD | Admitting: Family Medicine

## 2015-11-24 ENCOUNTER — Encounter: Payer: Self-pay | Admitting: Family Medicine

## 2015-11-24 VITALS — BP 112/63 | HR 79 | Temp 98.2°F | Ht 68.0 in | Wt 197.1 lb

## 2015-11-24 DIAGNOSIS — Z0000 Encounter for general adult medical examination without abnormal findings: Secondary | ICD-10-CM | POA: Diagnosis not present

## 2015-11-24 DIAGNOSIS — Z23 Encounter for immunization: Secondary | ICD-10-CM | POA: Diagnosis not present

## 2015-11-24 DIAGNOSIS — Z1159 Encounter for screening for other viral diseases: Secondary | ICD-10-CM | POA: Diagnosis not present

## 2015-11-24 DIAGNOSIS — F330 Major depressive disorder, recurrent, mild: Secondary | ICD-10-CM

## 2015-11-24 DIAGNOSIS — E038 Other specified hypothyroidism: Secondary | ICD-10-CM

## 2015-11-24 LAB — TSH: TSH: 16.749 u[IU]/mL — AB (ref 0.350–4.500)

## 2015-11-24 MED ORDER — LEVOTHYROXINE SODIUM 150 MCG PO TABS: 150.0000 ug | ORAL_TABLET | Freq: Every day | ORAL | Status: DC

## 2015-11-24 MED ORDER — CITALOPRAM HYDROBROMIDE 20 MG PO TABS: 20.0000 mg | ORAL_TABLET | Freq: Every day | ORAL | Status: DC

## 2015-11-24 MED ORDER — ZOSTER VACCINE LIVE 19400 UNT/0.65ML ~~LOC~~ SOLR: 0.6500 mL | Freq: Once | SUBCUTANEOUS | Status: AC

## 2015-11-24 NOTE — Assessment & Plan Note (Signed)
Check tsh Resume current replacement dose

## 2015-11-24 NOTE — Assessment & Plan Note (Signed)
Worsened likely due to being off antidepressant.  Will restart and monitor

## 2015-11-24 NOTE — Patient Instructions (Addendum)
Good to see you today!  Thanks for coming in.  Exercise - 3-5 x times a week for 20 min  Weight - aim to get rid of 2 lbs a week - Less Dr Reino KentPepper, pasta - change to tea   I will call you if your tests are not good.  Otherwise I will send you a letter.  If you do not hear from me with in 2 weeks please call our office.     Start the synthroid back once daily for a week then start the celexa one a day  Come back in one year or sooner if you are not improving

## 2015-11-24 NOTE — Progress Notes (Signed)
   Subjective:    Patient ID: Krista Daugherty, female    DOB: May 16, 1956, 59 y.o.   MRN: [id number]  HPI  Feels well except as below  Depression Off celexa for one month.  Feels worse with more depressive symptoms - feeling down tired over eating.  No suicidal ideation  Hypothyroid Off replacement for one month.  Symptoms as above.  No heat intolerance  Patient reports no  vision/ hearing changes,anorexia, weight change, fever ,adenopathy, persistant / recurrent hoarseness, swallowing issues, chest pain, edema,persistant / recurrent cough, hemoptysis, dyspnea(rest, exertional, paroxysmal nocturnal), gastrointestinal  bleeding (melena, rectal bleeding), abdominal pain, excessive heart burn, GU symptoms(dysuria, hematuria, pyuria, voiding/incontinence  Issues) syncope, focal weakness, severe memory loss, concerning skin lesions, abnormal bruising/bleeding, major joint swelling, breast masses or abnormal vaginal bleeding.     Review of Systems     Objective:   Physical Exam  Alert nad Neck:  No deformities, thyromegaly, masses, or tenderness noted.   Supple with full range of motion without pain. Heart - Regular rate and rhythm.  No murmurs, gallops or rubs.    Lungs:  Normal respiratory effort, chest expands symmetrically. Lungs are clear to auscultation, no crackles or wheezes. Abdomen: soft and non-tender without masses, organomegaly or hernias noted.  No guarding or rebound Extremities:  No cyanosis, edema, or deformity noted with good range of motion of all major joints.   Skin:  Intact without suspicious lesions or rashes Mouth - no lesions, mucous membranes are moist, no decaying teeth       Assessment & Plan:    CPE - see AVS

## 2015-11-25 ENCOUNTER — Encounter: Payer: Self-pay | Admitting: Family Medicine

## 2015-11-25 LAB — HEPATITIS C ANTIBODY: HCV Ab: NEGATIVE

## 2016-12-05 ENCOUNTER — Other Ambulatory Visit: Payer: Self-pay | Admitting: Family Medicine

## 2016-12-14 ENCOUNTER — Other Ambulatory Visit: Payer: Self-pay | Admitting: Family Medicine

## 2017-02-02 ENCOUNTER — Other Ambulatory Visit: Payer: Self-pay | Admitting: Family Medicine

## 2017-02-02 DIAGNOSIS — Z1231 Encounter for screening mammogram for malignant neoplasm of breast: Secondary | ICD-10-CM

## 2017-02-09 ENCOUNTER — Telehealth: Payer: Self-pay | Admitting: Family Medicine

## 2017-02-09 NOTE — Telephone Encounter (Signed)
Left message requesting pt to call back to schedule follow-up with PCP -Mesha Guinyard

## 2017-02-20 ENCOUNTER — Ambulatory Visit
Admission: RE | Admit: 2017-02-20 | Discharge: 2017-02-20 | Disposition: A | Discharge status: Home / Self Care | Payer: BLUE CROSS/BLUE SHIELD | Source: Ambulatory Visit | Attending: Family Medicine | Admitting: Family Medicine

## 2017-02-20 DIAGNOSIS — Z1231 Encounter for screening mammogram for malignant neoplasm of breast: Secondary | ICD-10-CM

## 2017-09-06 ENCOUNTER — Encounter: Payer: Self-pay | Admitting: Family Medicine

## 2018-01-01 ENCOUNTER — Other Ambulatory Visit: Payer: Self-pay | Admitting: Family Medicine

## 2018-01-01 DIAGNOSIS — N644 Mastodynia: Secondary | ICD-10-CM

## 2018-01-08 ENCOUNTER — Ambulatory Visit
Admission: RE | Admit: 2018-01-08 | Discharge: 2018-01-08 | Disposition: A | Discharge status: Home / Self Care | Payer: BLUE CROSS/BLUE SHIELD | Source: Ambulatory Visit | Attending: Family Medicine | Admitting: Family Medicine

## 2018-01-08 ENCOUNTER — Ambulatory Visit: Payer: BLUE CROSS/BLUE SHIELD

## 2018-01-08 DIAGNOSIS — N644 Mastodynia: Secondary | ICD-10-CM

## 2018-01-16 ENCOUNTER — Telehealth: Payer: Self-pay | Admitting: *Deleted

## 2018-01-16 NOTE — Telephone Encounter (Signed)
[facility]. Medical requested sent via-fax .

## 2018-03-13 ENCOUNTER — Telehealth: Payer: Self-pay | Admitting: Family Medicine

## 2018-03-13 NOTE — Telephone Encounter (Signed)
Called pt left voicemail

## 2018-05-24 ENCOUNTER — Encounter: Payer: Self-pay | Admitting: Family Medicine

## 2019-03-07 IMAGING — MG 2D DIGITAL DIAGNOSTIC UNILATERAL RIGHT MAMMOGRAM WITH CAD AND AD
6 series · 6 of 14 positions shown · non-contrast
Comparison: Previous exam(s).

CLINICAL DATA: 61-year-old female presenting for evaluation of
intermittent focal pain in the lateral aspect of the right breast
for one month. She has not had pain for the past few days, and does
not have pain today.

EXAM:
2D DIGITAL DIAGNOSTIC UNILATERAL RIGHT MAMMOGRAM WITH CAD AND
ADJUNCT TOMO

[R MLO synth-2D]
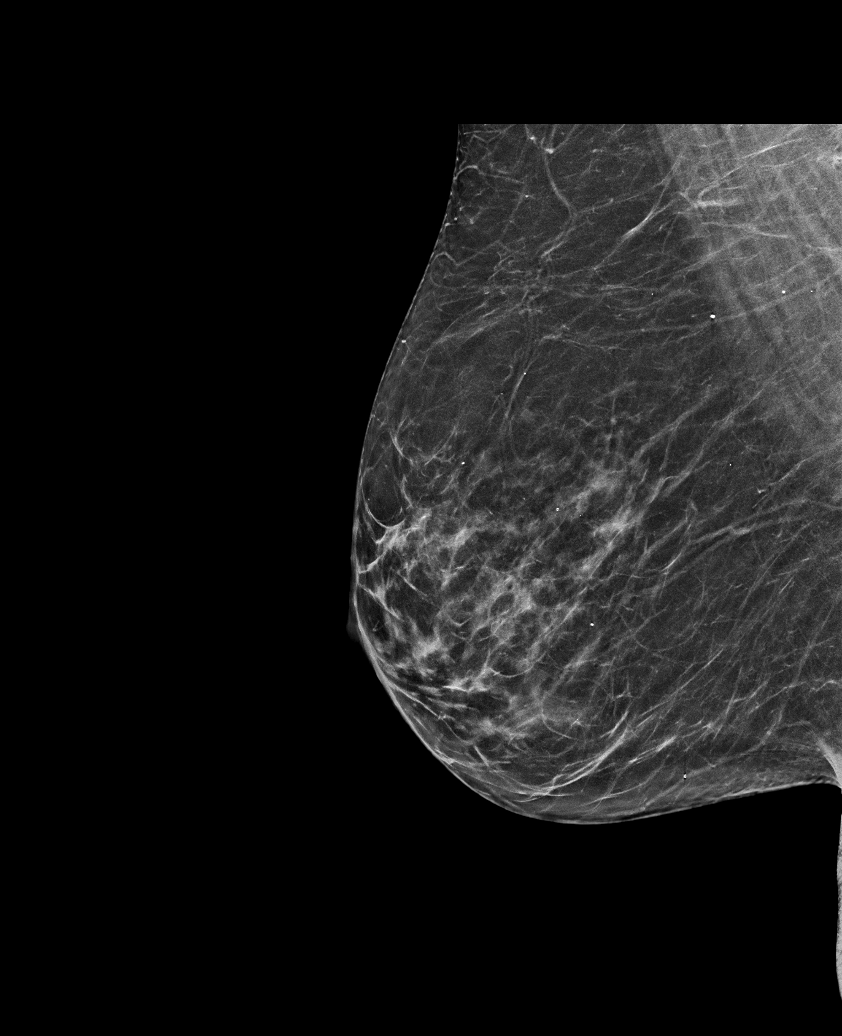

[R CC]
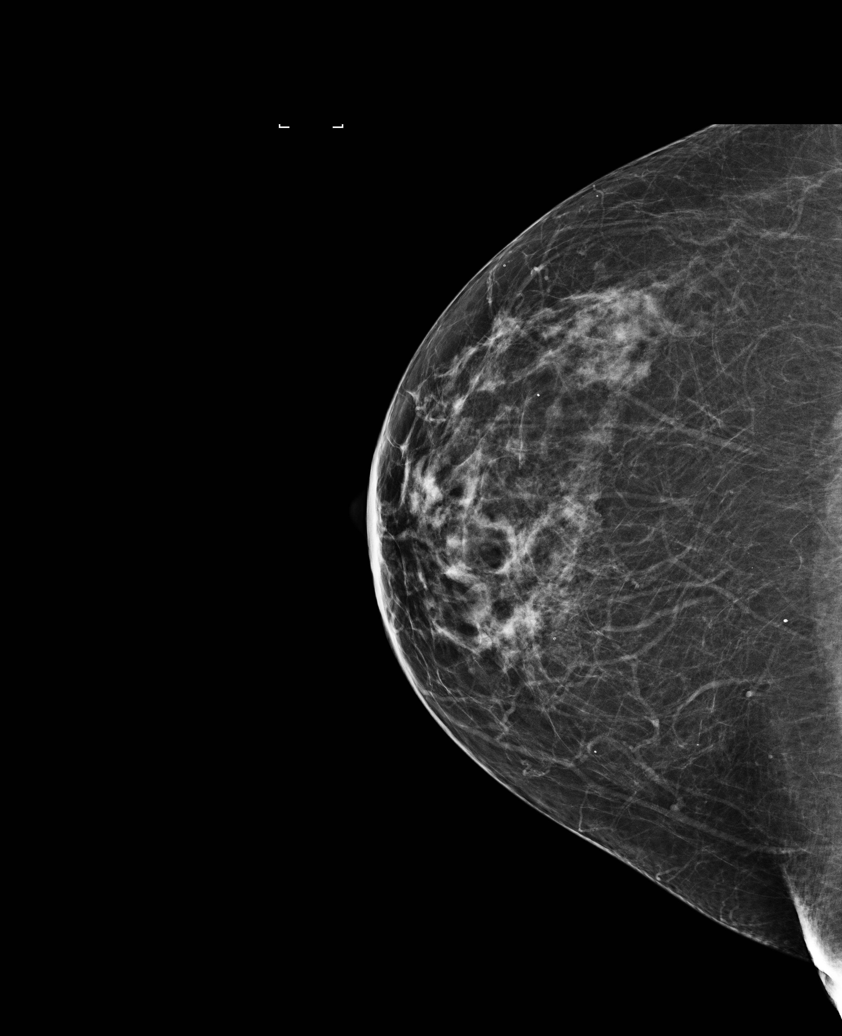

[R MLO]
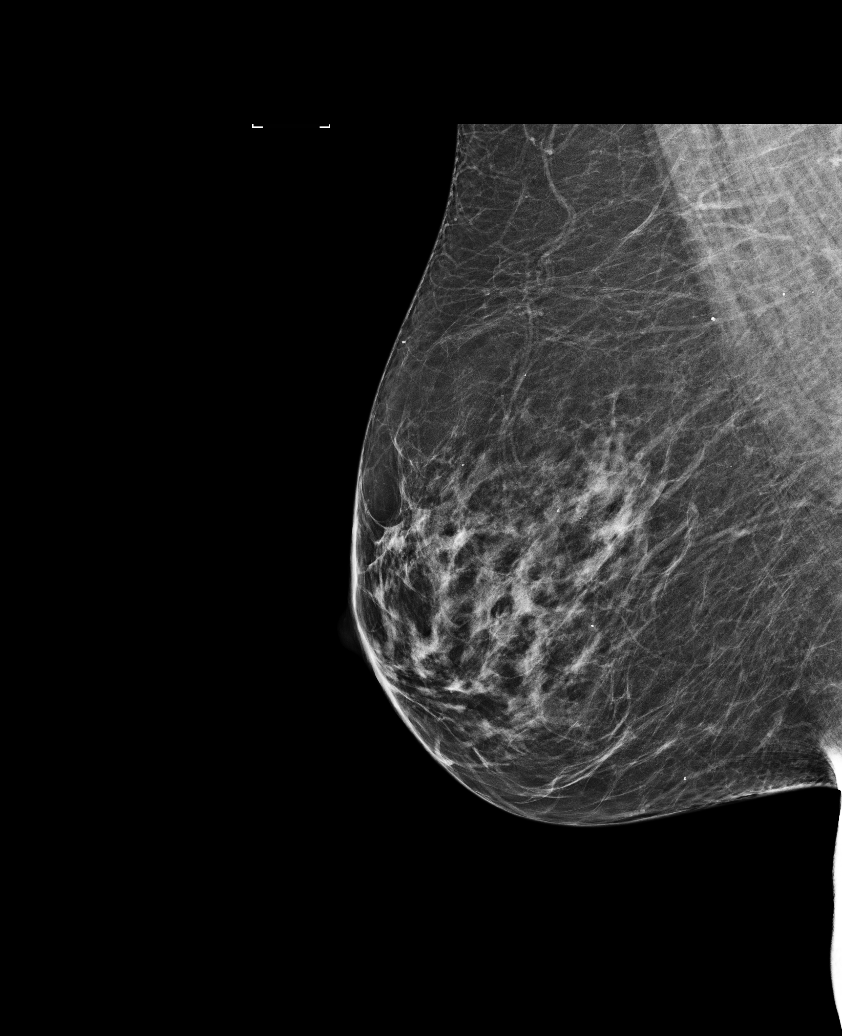

[R CC synth-2D]
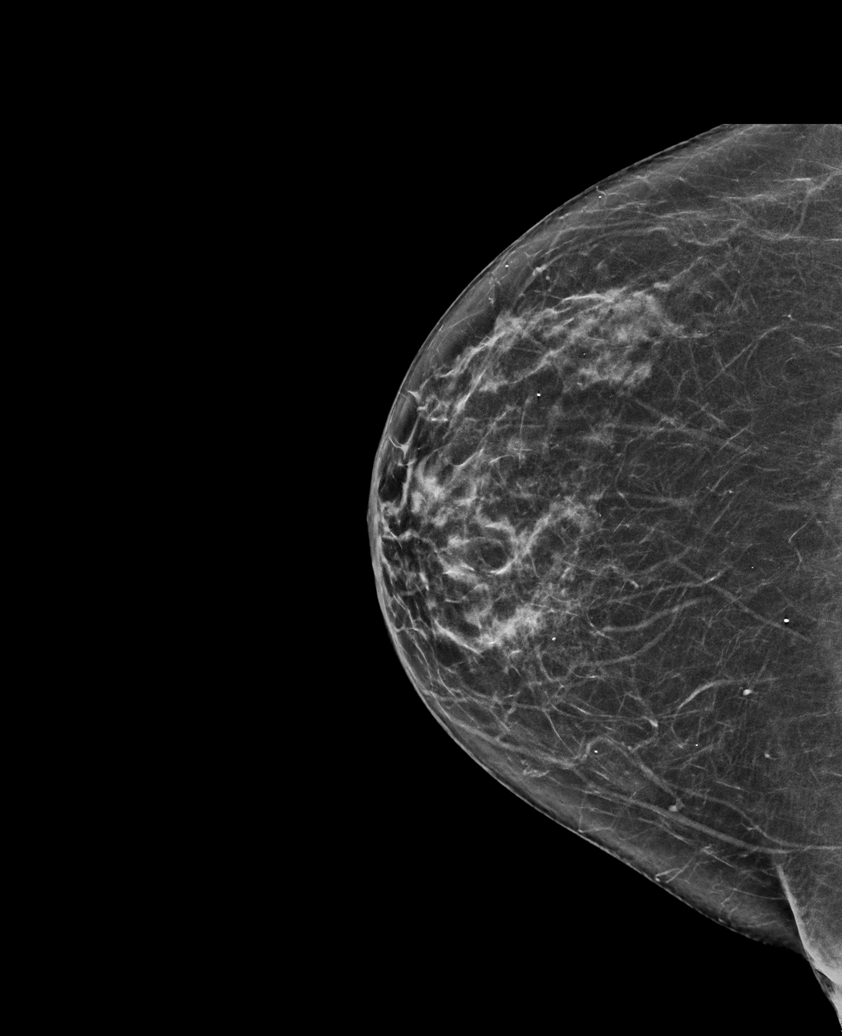

[R CC tomo · tomo slice 33/65.0]
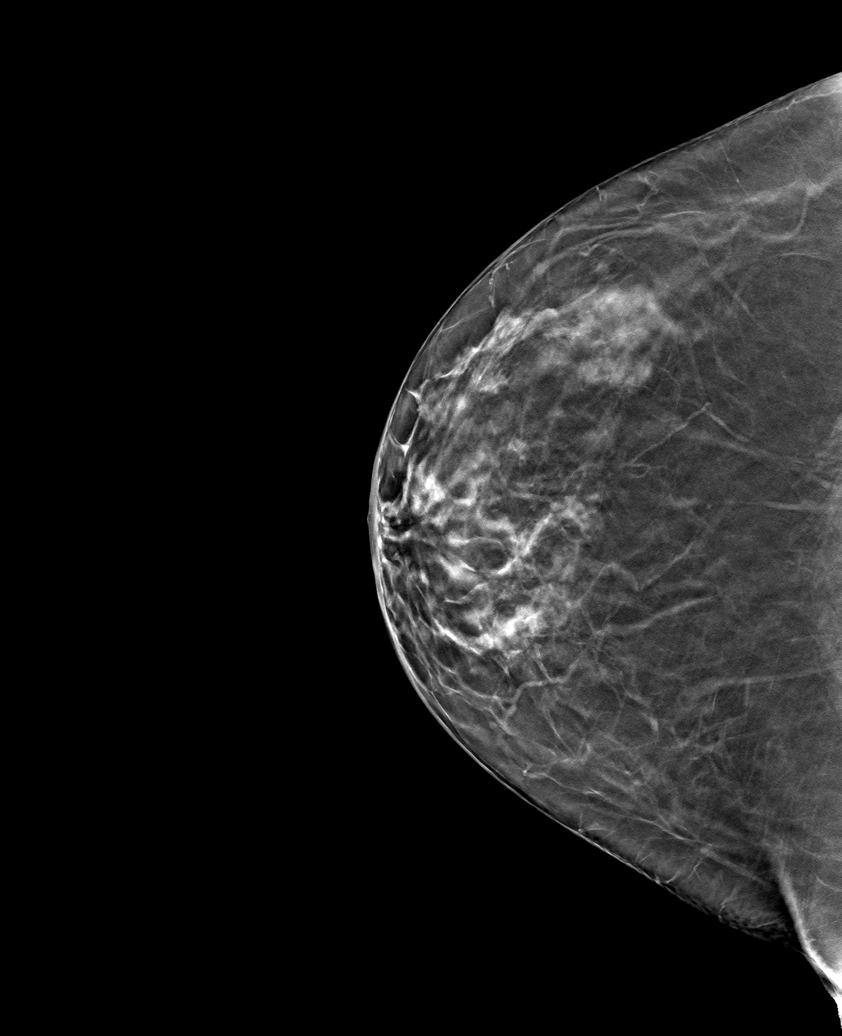

[R MLO tomo · tomo slice 34/67.0]
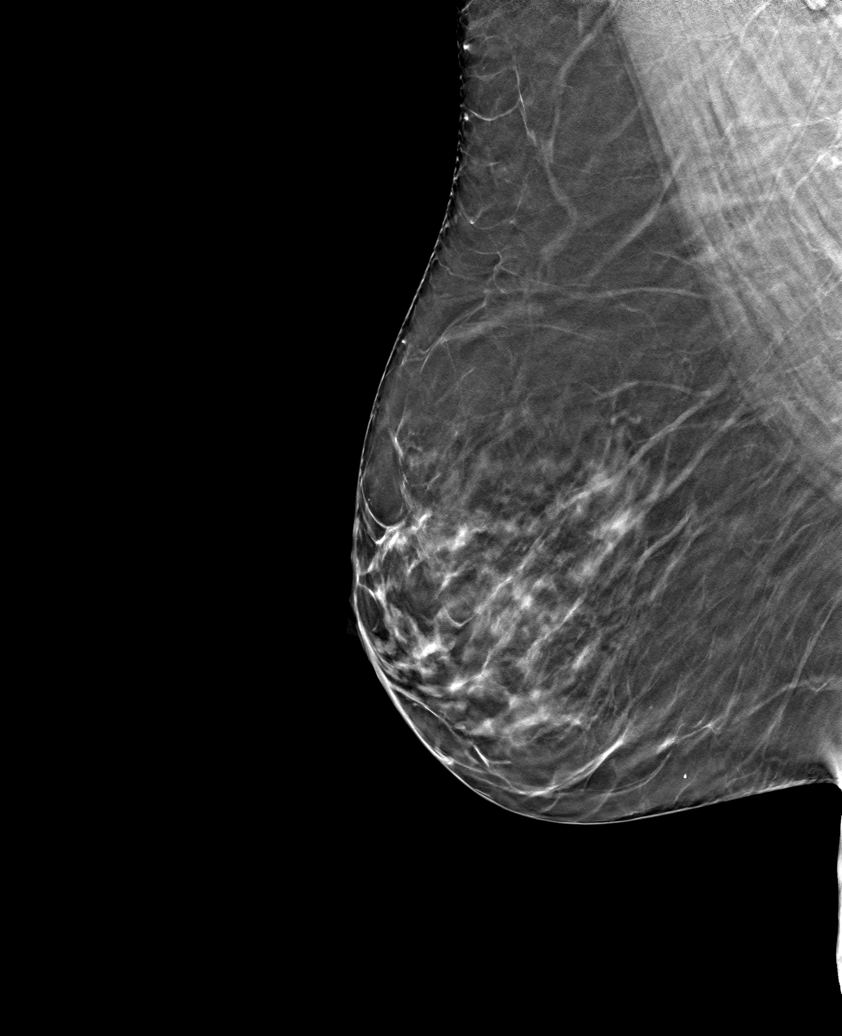

[6 of 14 positions shown; findings below may reference images not displayed]

ACR Breast Density Category b: There are scattered areas of
fibroglandular density.
FINDINGS: There are no suspicious mammographic findings in the lateral aspect
of the right breast to account for the patient's intermittent focal
breast pain. No suspicious calcifications, masses or areas of
distortion are seen in the right breast.

Mammographic images were processed with CAD.
IMPRESSION: 1. No suspicious mammographic findings to explain the patient's
intermittent lateral right breast pain.

2.  No mammographic evidence of right breast malignancy.

RECOMMENDATION:
1. Return to routine screening mammography is recommended. The
patient will be due for screening in Thursday January, 2018.

2. Clinical follow-up recommended for the tender area of concern in
the lateral right breast. Since the pain has resolved, ultrasound
was not performed today. However, I advised the patient that if the
pain returns, or worsens she may return for a targeted ultrasound.
She agreed with this plan. Any additional further workup should be
based on clinical grounds.

I have discussed the findings and recommendations with the patient.
Results were also provided in writing at the conclusion of the
visit. If applicable, a reminder letter will be sent to the patient
regarding the next appointment.

BI-RADS CATEGORY  1: Negative.
